# Patient Record
Sex: Female | Born: 1950 | Race: Black or African American | Hispanic: No | Smoking: Former smoker
Health system: Southern US, Community
[De-identification: ages and names within clinical notes are randomized; demographics above are authoritative.]

## PROBLEM LIST (undated history)

## (undated) DIAGNOSIS — T380X5A Adverse effect of glucocorticoids and synthetic analogues, initial encounter: Secondary | ICD-10-CM

## (undated) DIAGNOSIS — H548 Legal blindness, as defined in USA: Secondary | ICD-10-CM

## (undated) DIAGNOSIS — K219 Gastro-esophageal reflux disease without esophagitis: Secondary | ICD-10-CM

## (undated) DIAGNOSIS — E099 Drug or chemical induced diabetes mellitus without complications: Secondary | ICD-10-CM

## (undated) DIAGNOSIS — I776 Arteritis, unspecified: Secondary | ICD-10-CM

## (undated) HISTORY — PX: ABDOMINAL HYSTERECTOMY: SHX81

## (undated) HISTORY — DX: Legal blindness, as defined in USA: H54.8

## (undated) HISTORY — DX: Adverse effect of glucocorticoids and synthetic analogues, initial encounter: T38.0X5A

## (undated) HISTORY — DX: Drug or chemical induced diabetes mellitus without complications: E09.9

## (undated) HISTORY — DX: Arteritis, unspecified: I77.6

## (undated) HISTORY — DX: Gastro-esophageal reflux disease without esophagitis: K21.9

---

## 2000-02-28 ENCOUNTER — Emergency Department (HOSPITAL_COMMUNITY): Admission: EM | Admit: 2000-02-28 | Discharge: 2000-02-28 | Payer: Self-pay | Admitting: Emergency Medicine

## 2000-03-13 ENCOUNTER — Other Ambulatory Visit: Admission: RE | Admit: 2000-03-13 | Discharge: 2000-03-13 | Payer: Self-pay

## 2001-02-17 ENCOUNTER — Other Ambulatory Visit: Admission: RE | Admit: 2001-02-17 | Discharge: 2001-02-17 | Payer: Self-pay | Admitting: Family Medicine

## 2001-09-22 ENCOUNTER — Encounter: Payer: Self-pay | Admitting: Family Medicine

## 2001-09-22 ENCOUNTER — Encounter: Admission: RE | Admit: 2001-09-22 | Discharge: 2001-09-22 | Payer: Self-pay | Admitting: Family Medicine

## 2001-09-24 ENCOUNTER — Encounter: Admission: RE | Admit: 2001-09-24 | Discharge: 2001-09-24 | Payer: Self-pay | Admitting: Family Medicine

## 2001-09-24 ENCOUNTER — Encounter: Payer: Self-pay | Admitting: Family Medicine

## 2001-09-24 ENCOUNTER — Encounter (INDEPENDENT_AMBULATORY_CARE_PROVIDER_SITE_OTHER): Payer: Self-pay | Admitting: *Deleted

## 2002-03-08 ENCOUNTER — Emergency Department (HOSPITAL_COMMUNITY): Admission: EM | Admit: 2002-03-08 | Discharge: 2002-03-08 | Payer: Self-pay | Admitting: Emergency Medicine

## 2002-03-09 ENCOUNTER — Emergency Department (HOSPITAL_COMMUNITY): Admission: EM | Admit: 2002-03-09 | Discharge: 2002-03-09 | Payer: Self-pay | Admitting: Emergency Medicine

## 2002-11-02 ENCOUNTER — Other Ambulatory Visit: Admission: RE | Admit: 2002-11-02 | Discharge: 2002-11-02 | Payer: Self-pay | Admitting: Family Medicine

## 2003-12-18 ENCOUNTER — Emergency Department (HOSPITAL_COMMUNITY): Admission: AD | Admit: 2003-12-18 | Discharge: 2003-12-18 | Payer: Self-pay | Admitting: Family Medicine

## 2004-04-21 ENCOUNTER — Emergency Department (HOSPITAL_COMMUNITY): Admission: EM | Admit: 2004-04-21 | Discharge: 2004-04-22 | Payer: Self-pay | Admitting: Emergency Medicine

## 2006-03-08 ENCOUNTER — Other Ambulatory Visit: Admission: RE | Admit: 2006-03-08 | Discharge: 2006-03-08 | Payer: Self-pay | Admitting: Family Medicine

## 2006-12-11 ENCOUNTER — Encounter: Admission: RE | Admit: 2006-12-11 | Discharge: 2007-01-23 | Payer: Self-pay | Admitting: Family Medicine

## 2007-01-24 ENCOUNTER — Encounter: Admission: RE | Admit: 2007-01-24 | Discharge: 2007-02-05 | Payer: Self-pay | Admitting: Family Medicine

## 2007-04-23 ENCOUNTER — Encounter: Admission: RE | Admit: 2007-04-23 | Discharge: 2007-04-23 | Payer: Self-pay | Admitting: Family Medicine

## 2007-07-18 ENCOUNTER — Encounter: Admission: RE | Admit: 2007-07-18 | Discharge: 2007-07-18 | Payer: Self-pay | Admitting: Family Medicine

## 2009-10-05 ENCOUNTER — Encounter: Admission: RE | Admit: 2009-10-05 | Discharge: 2009-10-05 | Payer: Self-pay | Admitting: Family Medicine

## 2010-06-05 ENCOUNTER — Encounter: Admission: RE | Admit: 2010-06-05 | Discharge: 2010-06-05 | Payer: Self-pay | Admitting: Family Medicine

## 2012-12-31 ENCOUNTER — Other Ambulatory Visit: Payer: Self-pay

## 2013-03-11 ENCOUNTER — Ambulatory Visit
Admission: RE | Admit: 2013-03-11 | Discharge: 2013-03-11 | Disposition: A | Discharge status: Home / Self Care | Payer: Medicare PPO | Source: Ambulatory Visit

## 2013-03-11 DIAGNOSIS — Z1231 Encounter for screening mammogram for malignant neoplasm of breast: Secondary | ICD-10-CM

## 2014-09-18 ENCOUNTER — Encounter: Payer: Self-pay | Admitting: *Deleted

## 2015-01-03 DIAGNOSIS — Z970 Presence of artificial eye: Secondary | ICD-10-CM | POA: Diagnosis not present

## 2015-01-03 DIAGNOSIS — H548 Legal blindness, as defined in USA: Secondary | ICD-10-CM | POA: Diagnosis not present

## 2015-01-03 DIAGNOSIS — H4011X3 Primary open-angle glaucoma, severe stage: Secondary | ICD-10-CM | POA: Diagnosis not present

## 2015-07-12 DIAGNOSIS — E119 Type 2 diabetes mellitus without complications: Secondary | ICD-10-CM | POA: Diagnosis not present

## 2015-07-12 DIAGNOSIS — Z23 Encounter for immunization: Secondary | ICD-10-CM | POA: Diagnosis not present

## 2015-07-12 DIAGNOSIS — L109 Pemphigus, unspecified: Secondary | ICD-10-CM | POA: Diagnosis not present

## 2015-07-12 DIAGNOSIS — E780 Pure hypercholesterolemia: Secondary | ICD-10-CM | POA: Diagnosis not present

## 2015-07-12 DIAGNOSIS — R002 Palpitations: Secondary | ICD-10-CM | POA: Diagnosis not present

## 2015-07-22 DIAGNOSIS — L309 Dermatitis, unspecified: Secondary | ICD-10-CM | POA: Diagnosis not present

## 2016-02-21 ENCOUNTER — Other Ambulatory Visit: Payer: Self-pay

## 2016-02-21 DIAGNOSIS — Z1231 Encounter for screening mammogram for malignant neoplasm of breast: Secondary | ICD-10-CM

## 2016-05-11 ENCOUNTER — Ambulatory Visit
Admission: RE | Admit: 2016-05-11 | Discharge: 2016-05-11 | Disposition: A | Discharge status: Home / Self Care | Payer: Medicare Other | Source: Ambulatory Visit

## 2016-05-11 DIAGNOSIS — Z1231 Encounter for screening mammogram for malignant neoplasm of breast: Secondary | ICD-10-CM

## 2018-02-06 ENCOUNTER — Other Ambulatory Visit (HOSPITAL_COMMUNITY): Payer: Self-pay | Admitting: Physician Assistant

## 2018-02-06 DIAGNOSIS — M79605 Pain in left leg: Secondary | ICD-10-CM

## 2018-02-06 DIAGNOSIS — M7989 Other specified soft tissue disorders: Principal | ICD-10-CM

## 2018-02-07 ENCOUNTER — Ambulatory Visit (HOSPITAL_COMMUNITY)
Admission: RE | Admit: 2018-02-07 | Discharge: 2018-02-07 | Disposition: A | Discharge status: Home / Self Care | Payer: Medicare Other | Source: Ambulatory Visit | Attending: Physician Assistant | Admitting: Physician Assistant

## 2018-02-07 DIAGNOSIS — M79605 Pain in left leg: Secondary | ICD-10-CM | POA: Insufficient documentation

## 2018-02-07 DIAGNOSIS — M7989 Other specified soft tissue disorders: Secondary | ICD-10-CM | POA: Insufficient documentation

## 2018-02-07 NOTE — Progress Notes (Signed)
LLE venous duplex prelim: negative for DVT. Farrel DemarkJill Eunice, RDMS, RVT  Attempted to call report to Azucena FallenMichelle Young with number given, as well as main line to urgent care. Cannot reach anyone for report at this time.

## 2019-05-25 ENCOUNTER — Other Ambulatory Visit: Payer: Self-pay | Admitting: Family Medicine

## 2019-05-25 DIAGNOSIS — M8580 Other specified disorders of bone density and structure, unspecified site: Secondary | ICD-10-CM

## 2019-06-01 ENCOUNTER — Other Ambulatory Visit: Payer: Self-pay | Admitting: Family Medicine

## 2019-06-01 DIAGNOSIS — Z1231 Encounter for screening mammogram for malignant neoplasm of breast: Secondary | ICD-10-CM

## 2019-08-14 ENCOUNTER — Ambulatory Visit
Admission: RE | Admit: 2019-08-14 | Discharge: 2019-08-14 | Disposition: A | Discharge status: Home / Self Care | Payer: Medicare Other | Source: Ambulatory Visit | Attending: Family Medicine | Admitting: Family Medicine

## 2019-08-14 ENCOUNTER — Other Ambulatory Visit: Payer: Self-pay

## 2019-08-14 DIAGNOSIS — Z1231 Encounter for screening mammogram for malignant neoplasm of breast: Secondary | ICD-10-CM

## 2019-08-14 DIAGNOSIS — M8580 Other specified disorders of bone density and structure, unspecified site: Secondary | ICD-10-CM

## 2019-11-26 ENCOUNTER — Ambulatory Visit: Payer: Self-pay | Attending: Internal Medicine

## 2019-11-26 DIAGNOSIS — Z23 Encounter for immunization: Secondary | ICD-10-CM | POA: Insufficient documentation

## 2019-11-26 NOTE — Progress Notes (Signed)
   Covid-19 Vaccination Clinic  Name:  Alyss Granato    MRN: [id number] DOB: 1951/03/13  11/26/2019  Ms. Suleiman was observed post Covid-19 immunization for 15 minutes without incidence. She was provided with Vaccine Information Sheet and instruction to access the V-Safe system.   Ms. Aristizabal was instructed to call 911 with any severe reactions post vaccine: Marland Kitchen Difficulty breathing  . Swelling of your face and throat  . A fast heartbeat  . A bad rash all over your body  . Dizziness and weakness    Immunizations Administered    Name Date Dose VIS Date Route   Pfizer COVID-19 Vaccine 11/26/2019  2:28 PM 0.3 mL 10/16/2019 Intramuscular   Manufacturer: [employer], [employer]   Lot: [id number]   NDC: [address]-2

## 2019-12-17 ENCOUNTER — Ambulatory Visit: Payer: Self-pay | Attending: Internal Medicine

## 2019-12-17 DIAGNOSIS — Z23 Encounter for immunization: Secondary | ICD-10-CM | POA: Insufficient documentation

## 2019-12-17 NOTE — Progress Notes (Signed)
   Covid-19 Vaccination Clinic  Name:  Suha Schoenbeck    MRN: [id number] DOB: 1951-04-10  12/17/2019  Ms. Towell was observed post Covid-19 immunization for 15 minutes without incidence. She was provided with Vaccine Information Sheet and instruction to access the V-Safe system.   Ms. Huckaba was instructed to call 911 with any severe reactions post vaccine: Marland Kitchen Difficulty breathing  . Swelling of your face and throat  . A fast heartbeat  . A bad rash all over your body  . Dizziness and weakness    Immunizations Administered    Name Date Dose VIS Date Route   Pfizer COVID-19 Vaccine 12/17/2019  2:22 PM 0.3 mL 10/16/2019 Intramuscular   Manufacturer: [employer], [employer]   Lot: [id number]   NDC: [address]-3

## 2020-03-29 DIAGNOSIS — E669 Obesity, unspecified: Secondary | ICD-10-CM | POA: Diagnosis not present

## 2020-03-29 DIAGNOSIS — H548 Legal blindness, as defined in USA: Secondary | ICD-10-CM | POA: Diagnosis not present

## 2020-03-29 DIAGNOSIS — M2550 Pain in unspecified joint: Secondary | ICD-10-CM | POA: Diagnosis not present

## 2020-03-29 DIAGNOSIS — M318 Other specified necrotizing vasculopathies: Secondary | ICD-10-CM | POA: Diagnosis not present

## 2020-03-29 DIAGNOSIS — R768 Other specified abnormal immunological findings in serum: Secondary | ICD-10-CM | POA: Diagnosis not present

## 2020-03-29 DIAGNOSIS — R5382 Chronic fatigue, unspecified: Secondary | ICD-10-CM | POA: Diagnosis not present

## 2020-03-29 DIAGNOSIS — Z6836 Body mass index (BMI) 36.0-36.9, adult: Secondary | ICD-10-CM | POA: Diagnosis not present

## 2020-03-29 DIAGNOSIS — Z79899 Other long term (current) drug therapy: Secondary | ICD-10-CM | POA: Diagnosis not present

## 2020-03-29 DIAGNOSIS — M0579 Rheumatoid arthritis with rheumatoid factor of multiple sites without organ or systems involvement: Secondary | ICD-10-CM | POA: Diagnosis not present

## 2020-04-20 DIAGNOSIS — H401113 Primary open-angle glaucoma, right eye, severe stage: Secondary | ICD-10-CM | POA: Diagnosis not present

## 2020-04-20 DIAGNOSIS — Z961 Presence of intraocular lens: Secondary | ICD-10-CM | POA: Diagnosis not present

## 2020-04-20 DIAGNOSIS — H26491 Other secondary cataract, right eye: Secondary | ICD-10-CM | POA: Diagnosis not present

## 2020-05-31 DIAGNOSIS — J45909 Unspecified asthma, uncomplicated: Secondary | ICD-10-CM | POA: Diagnosis not present

## 2020-05-31 DIAGNOSIS — I10 Essential (primary) hypertension: Secondary | ICD-10-CM | POA: Diagnosis not present

## 2020-05-31 DIAGNOSIS — Z0000 Encounter for general adult medical examination without abnormal findings: Secondary | ICD-10-CM | POA: Diagnosis not present

## 2020-05-31 DIAGNOSIS — E1169 Type 2 diabetes mellitus with other specified complication: Secondary | ICD-10-CM | POA: Diagnosis not present

## 2020-05-31 DIAGNOSIS — E7800 Pure hypercholesterolemia, unspecified: Secondary | ICD-10-CM | POA: Diagnosis not present

## 2020-05-31 DIAGNOSIS — K219 Gastro-esophageal reflux disease without esophagitis: Secondary | ICD-10-CM | POA: Diagnosis not present

## 2020-05-31 DIAGNOSIS — M859 Disorder of bone density and structure, unspecified: Secondary | ICD-10-CM | POA: Diagnosis not present

## 2020-05-31 DIAGNOSIS — Z1389 Encounter for screening for other disorder: Secondary | ICD-10-CM | POA: Diagnosis not present

## 2020-05-31 DIAGNOSIS — M1990 Unspecified osteoarthritis, unspecified site: Secondary | ICD-10-CM | POA: Diagnosis not present

## 2020-06-29 DIAGNOSIS — Z6836 Body mass index (BMI) 36.0-36.9, adult: Secondary | ICD-10-CM | POA: Diagnosis not present

## 2020-06-29 DIAGNOSIS — R5382 Chronic fatigue, unspecified: Secondary | ICD-10-CM | POA: Diagnosis not present

## 2020-06-29 DIAGNOSIS — H548 Legal blindness, as defined in USA: Secondary | ICD-10-CM | POA: Diagnosis not present

## 2020-06-29 DIAGNOSIS — M318 Other specified necrotizing vasculopathies: Secondary | ICD-10-CM | POA: Diagnosis not present

## 2020-06-29 DIAGNOSIS — Z79899 Other long term (current) drug therapy: Secondary | ICD-10-CM | POA: Diagnosis not present

## 2020-06-29 DIAGNOSIS — E669 Obesity, unspecified: Secondary | ICD-10-CM | POA: Diagnosis not present

## 2020-06-29 DIAGNOSIS — M2550 Pain in unspecified joint: Secondary | ICD-10-CM | POA: Diagnosis not present

## 2020-06-29 DIAGNOSIS — M0579 Rheumatoid arthritis with rheumatoid factor of multiple sites without organ or systems involvement: Secondary | ICD-10-CM | POA: Diagnosis not present

## 2020-10-10 DIAGNOSIS — H548 Legal blindness, as defined in USA: Secondary | ICD-10-CM | POA: Diagnosis not present

## 2020-10-10 DIAGNOSIS — E669 Obesity, unspecified: Secondary | ICD-10-CM | POA: Diagnosis not present

## 2020-10-10 DIAGNOSIS — R5382 Chronic fatigue, unspecified: Secondary | ICD-10-CM | POA: Diagnosis not present

## 2020-10-10 DIAGNOSIS — Z6837 Body mass index (BMI) 37.0-37.9, adult: Secondary | ICD-10-CM | POA: Diagnosis not present

## 2020-10-10 DIAGNOSIS — M2550 Pain in unspecified joint: Secondary | ICD-10-CM | POA: Diagnosis not present

## 2020-10-10 DIAGNOSIS — Z79899 Other long term (current) drug therapy: Secondary | ICD-10-CM | POA: Diagnosis not present

## 2020-10-10 DIAGNOSIS — M0579 Rheumatoid arthritis with rheumatoid factor of multiple sites without organ or systems involvement: Secondary | ICD-10-CM | POA: Diagnosis not present

## 2020-10-10 DIAGNOSIS — M318 Other specified necrotizing vasculopathies: Secondary | ICD-10-CM | POA: Diagnosis not present

## 2020-11-16 DIAGNOSIS — M069 Rheumatoid arthritis, unspecified: Secondary | ICD-10-CM | POA: Diagnosis not present

## 2020-11-16 DIAGNOSIS — H401113 Primary open-angle glaucoma, right eye, severe stage: Secondary | ICD-10-CM | POA: Diagnosis not present

## 2020-11-16 DIAGNOSIS — H26491 Other secondary cataract, right eye: Secondary | ICD-10-CM | POA: Diagnosis not present

## 2020-11-16 DIAGNOSIS — Z961 Presence of intraocular lens: Secondary | ICD-10-CM | POA: Diagnosis not present

## 2020-12-01 DIAGNOSIS — I10 Essential (primary) hypertension: Secondary | ICD-10-CM | POA: Diagnosis not present

## 2020-12-01 DIAGNOSIS — L509 Urticaria, unspecified: Secondary | ICD-10-CM | POA: Diagnosis not present

## 2020-12-01 DIAGNOSIS — E1169 Type 2 diabetes mellitus with other specified complication: Secondary | ICD-10-CM | POA: Diagnosis not present

## 2020-12-01 DIAGNOSIS — H401113 Primary open-angle glaucoma, right eye, severe stage: Secondary | ICD-10-CM | POA: Diagnosis not present

## 2020-12-01 DIAGNOSIS — E7800 Pure hypercholesterolemia, unspecified: Secondary | ICD-10-CM | POA: Diagnosis not present

## 2020-12-01 DIAGNOSIS — H540X33 Blindness right eye category 3, blindness left eye category 3: Secondary | ICD-10-CM | POA: Diagnosis not present

## 2020-12-01 DIAGNOSIS — M069 Rheumatoid arthritis, unspecified: Secondary | ICD-10-CM | POA: Diagnosis not present

## 2020-12-01 DIAGNOSIS — H541131 Blindness right eye category 3, low vision left eye category 1: Secondary | ICD-10-CM | POA: Diagnosis not present

## 2021-01-09 DIAGNOSIS — M0579 Rheumatoid arthritis with rheumatoid factor of multiple sites without organ or systems involvement: Secondary | ICD-10-CM | POA: Diagnosis not present

## 2021-04-18 DIAGNOSIS — M318 Other specified necrotizing vasculopathies: Secondary | ICD-10-CM | POA: Diagnosis not present

## 2021-04-18 DIAGNOSIS — M0579 Rheumatoid arthritis with rheumatoid factor of multiple sites without organ or systems involvement: Secondary | ICD-10-CM | POA: Diagnosis not present

## 2021-04-18 DIAGNOSIS — H548 Legal blindness, as defined in USA: Secondary | ICD-10-CM | POA: Diagnosis not present

## 2021-04-18 DIAGNOSIS — R5382 Chronic fatigue, unspecified: Secondary | ICD-10-CM | POA: Diagnosis not present

## 2021-04-18 DIAGNOSIS — Z79899 Other long term (current) drug therapy: Secondary | ICD-10-CM | POA: Diagnosis not present

## 2021-04-18 DIAGNOSIS — Z6834 Body mass index (BMI) 34.0-34.9, adult: Secondary | ICD-10-CM | POA: Diagnosis not present

## 2021-04-18 DIAGNOSIS — E669 Obesity, unspecified: Secondary | ICD-10-CM | POA: Diagnosis not present

## 2021-04-18 DIAGNOSIS — M2550 Pain in unspecified joint: Secondary | ICD-10-CM | POA: Diagnosis not present

## 2021-05-15 DIAGNOSIS — H401113 Primary open-angle glaucoma, right eye, severe stage: Secondary | ICD-10-CM | POA: Diagnosis not present

## 2021-05-15 DIAGNOSIS — H26491 Other secondary cataract, right eye: Secondary | ICD-10-CM | POA: Diagnosis not present

## 2021-05-15 DIAGNOSIS — Z961 Presence of intraocular lens: Secondary | ICD-10-CM | POA: Diagnosis not present

## 2021-05-15 DIAGNOSIS — M069 Rheumatoid arthritis, unspecified: Secondary | ICD-10-CM | POA: Diagnosis not present

## 2021-06-02 DIAGNOSIS — I10 Essential (primary) hypertension: Secondary | ICD-10-CM | POA: Diagnosis not present

## 2021-06-02 DIAGNOSIS — E1169 Type 2 diabetes mellitus with other specified complication: Secondary | ICD-10-CM | POA: Diagnosis not present

## 2021-06-02 DIAGNOSIS — G4733 Obstructive sleep apnea (adult) (pediatric): Secondary | ICD-10-CM | POA: Diagnosis not present

## 2021-06-02 DIAGNOSIS — H543 Unqualified visual loss, both eyes: Secondary | ICD-10-CM | POA: Diagnosis not present

## 2021-06-02 DIAGNOSIS — K219 Gastro-esophageal reflux disease without esophagitis: Secondary | ICD-10-CM | POA: Diagnosis not present

## 2021-06-02 DIAGNOSIS — M1990 Unspecified osteoarthritis, unspecified site: Secondary | ICD-10-CM | POA: Diagnosis not present

## 2021-06-02 DIAGNOSIS — E7800 Pure hypercholesterolemia, unspecified: Secondary | ICD-10-CM | POA: Diagnosis not present

## 2021-06-02 DIAGNOSIS — Z1159 Encounter for screening for other viral diseases: Secondary | ICD-10-CM | POA: Diagnosis not present

## 2021-06-02 DIAGNOSIS — Z0000 Encounter for general adult medical examination without abnormal findings: Secondary | ICD-10-CM | POA: Diagnosis not present

## 2021-06-02 DIAGNOSIS — Z1389 Encounter for screening for other disorder: Secondary | ICD-10-CM | POA: Diagnosis not present

## 2021-06-06 ENCOUNTER — Other Ambulatory Visit: Payer: Self-pay | Admitting: Family Medicine

## 2021-06-06 DIAGNOSIS — Z1231 Encounter for screening mammogram for malignant neoplasm of breast: Secondary | ICD-10-CM

## 2021-06-16 DIAGNOSIS — G4733 Obstructive sleep apnea (adult) (pediatric): Secondary | ICD-10-CM | POA: Diagnosis not present

## 2021-06-16 DIAGNOSIS — E669 Obesity, unspecified: Secondary | ICD-10-CM | POA: Diagnosis not present

## 2021-06-16 DIAGNOSIS — M069 Rheumatoid arthritis, unspecified: Secondary | ICD-10-CM | POA: Diagnosis not present

## 2021-06-16 DIAGNOSIS — I10 Essential (primary) hypertension: Secondary | ICD-10-CM | POA: Diagnosis not present

## 2021-06-23 DIAGNOSIS — N3941 Urge incontinence: Secondary | ICD-10-CM | POA: Diagnosis not present

## 2021-06-23 DIAGNOSIS — Z811 Family history of alcohol abuse and dependence: Secondary | ICD-10-CM | POA: Diagnosis not present

## 2021-06-23 DIAGNOSIS — H548 Legal blindness, as defined in USA: Secondary | ICD-10-CM | POA: Diagnosis not present

## 2021-06-23 DIAGNOSIS — M069 Rheumatoid arthritis, unspecified: Secondary | ICD-10-CM | POA: Diagnosis not present

## 2021-06-23 DIAGNOSIS — H409 Unspecified glaucoma: Secondary | ICD-10-CM | POA: Diagnosis not present

## 2021-06-23 DIAGNOSIS — G4733 Obstructive sleep apnea (adult) (pediatric): Secondary | ICD-10-CM | POA: Diagnosis not present

## 2021-06-23 DIAGNOSIS — Z6836 Body mass index (BMI) 36.0-36.9, adult: Secondary | ICD-10-CM | POA: Diagnosis not present

## 2021-06-23 DIAGNOSIS — R609 Edema, unspecified: Secondary | ICD-10-CM | POA: Diagnosis not present

## 2021-06-23 DIAGNOSIS — M8580 Other specified disorders of bone density and structure, unspecified site: Secondary | ICD-10-CM | POA: Diagnosis not present

## 2021-06-23 DIAGNOSIS — D84821 Immunodeficiency due to drugs: Secondary | ICD-10-CM | POA: Diagnosis not present

## 2021-06-23 DIAGNOSIS — G8929 Other chronic pain: Secondary | ICD-10-CM | POA: Diagnosis not present

## 2021-06-23 DIAGNOSIS — Z79899 Other long term (current) drug therapy: Secondary | ICD-10-CM | POA: Diagnosis not present

## 2021-06-23 DIAGNOSIS — E785 Hyperlipidemia, unspecified: Secondary | ICD-10-CM | POA: Diagnosis not present

## 2021-06-23 DIAGNOSIS — E119 Type 2 diabetes mellitus without complications: Secondary | ICD-10-CM | POA: Diagnosis not present

## 2021-06-23 DIAGNOSIS — Z7982 Long term (current) use of aspirin: Secondary | ICD-10-CM | POA: Diagnosis not present

## 2021-06-23 DIAGNOSIS — Z7409 Other reduced mobility: Secondary | ICD-10-CM | POA: Diagnosis not present

## 2021-06-23 DIAGNOSIS — Z818 Family history of other mental and behavioral disorders: Secondary | ICD-10-CM | POA: Diagnosis not present

## 2021-07-18 DIAGNOSIS — I10 Essential (primary) hypertension: Secondary | ICD-10-CM | POA: Diagnosis not present

## 2021-07-18 DIAGNOSIS — R001 Bradycardia, unspecified: Secondary | ICD-10-CM | POA: Diagnosis not present

## 2021-07-24 DIAGNOSIS — G4733 Obstructive sleep apnea (adult) (pediatric): Secondary | ICD-10-CM | POA: Diagnosis not present

## 2021-07-24 DIAGNOSIS — I10 Essential (primary) hypertension: Secondary | ICD-10-CM | POA: Diagnosis not present

## 2021-07-24 DIAGNOSIS — M069 Rheumatoid arthritis, unspecified: Secondary | ICD-10-CM | POA: Diagnosis not present

## 2021-07-24 DIAGNOSIS — E669 Obesity, unspecified: Secondary | ICD-10-CM | POA: Diagnosis not present

## 2021-07-27 ENCOUNTER — Ambulatory Visit: Payer: Self-pay

## 2021-07-28 ENCOUNTER — Ambulatory Visit: Payer: Self-pay | Admitting: Cardiovascular Disease

## 2021-08-14 DIAGNOSIS — Z79899 Other long term (current) drug therapy: Secondary | ICD-10-CM | POA: Diagnosis not present

## 2021-08-14 DIAGNOSIS — M0579 Rheumatoid arthritis with rheumatoid factor of multiple sites without organ or systems involvement: Secondary | ICD-10-CM | POA: Diagnosis not present

## 2021-09-08 ENCOUNTER — Other Ambulatory Visit (HOSPITAL_BASED_OUTPATIENT_CLINIC_OR_DEPARTMENT_OTHER): Payer: Self-pay

## 2021-09-08 ENCOUNTER — Other Ambulatory Visit: Payer: Self-pay

## 2021-09-08 ENCOUNTER — Ambulatory Visit
Admission: RE | Admit: 2021-09-08 | Discharge: 2021-09-08 | Disposition: A | Discharge status: Home / Self Care | Payer: Medicare PPO | Source: Ambulatory Visit | Attending: Family Medicine | Admitting: Family Medicine

## 2021-09-08 DIAGNOSIS — R0683 Snoring: Secondary | ICD-10-CM

## 2021-09-08 DIAGNOSIS — Z1231 Encounter for screening mammogram for malignant neoplasm of breast: Secondary | ICD-10-CM

## 2021-09-08 DIAGNOSIS — R0681 Apnea, not elsewhere classified: Secondary | ICD-10-CM

## 2021-09-08 DIAGNOSIS — G4710 Hypersomnia, unspecified: Secondary | ICD-10-CM

## 2021-09-09 DIAGNOSIS — Z23 Encounter for immunization: Secondary | ICD-10-CM | POA: Diagnosis not present

## 2021-09-13 ENCOUNTER — Other Ambulatory Visit: Payer: Self-pay | Admitting: Family Medicine

## 2021-09-13 DIAGNOSIS — R928 Other abnormal and inconclusive findings on diagnostic imaging of breast: Secondary | ICD-10-CM

## 2021-10-05 ENCOUNTER — Encounter: Payer: Self-pay | Admitting: Cardiology

## 2021-10-05 ENCOUNTER — Inpatient Hospital Stay: Payer: Medicare PPO

## 2021-10-05 ENCOUNTER — Other Ambulatory Visit: Payer: Self-pay

## 2021-10-05 ENCOUNTER — Ambulatory Visit: Payer: Medicare PPO | Admitting: Cardiology

## 2021-10-05 VITALS — BP 135/74 | HR 56 | Temp 98.1°F | Resp 16 | Ht 64.0 in | Wt 195.8 lb

## 2021-10-05 DIAGNOSIS — R001 Bradycardia, unspecified: Secondary | ICD-10-CM

## 2021-10-05 NOTE — Progress Notes (Signed)
Patient referred by Maurice Small, MD for bradycardia  Subjective:   Kim Henry, female    DOB: 05-13-1951, 70 y.o.   MRN: [id number]   Chief Complaint  Patient presents with   Bradycardia   New Patient (Initial Visit)    Referred by Maurice Small, MD     HPI  70 y.o. African-American female with h/o vasculitis, steroid-induced diabetes, legally blind, referred for bradycardia.  Patient is retired, used to work at [employer].  She was recently noticed to have heart rate in 40s and 50s.  She denies any symptoms of chest pain, shortness of breath, lightheadedness, presyncope or syncope.  She has a history of vasculitis with pain and swelling in left leg in the past.  She has been on medications for the same, including prednisone, methotrexate.  She is also on aspirin, but states that this was not started for her vasculitis but rather due to family history of heart disease.  Patient does not have known diagnosis of ASCVD.   Past Medical History:  Diagnosis Date   GERD (gastroesophageal reflux disease)    Legally blind    Steroid-induced diabetes (HCC)    Vasculitis (HCC)      Past Surgical History:  Procedure Laterality Date   ABDOMINAL HYSTERECTOMY       Social History   Tobacco Use  Smoking Status Former  Smokeless Tobacco Not on file    Social History   Substance and Sexual Activity  Alcohol Use No     Family History  Family history unknown: Yes     Current Outpatient Medications on File Prior to Visit  Medication Sig Dispense Refill   aspirin 81 MG chewable tablet Chew by mouth daily.     calcium carbonate 200 MG capsule Take 250 mg by mouth 2 (two) times daily with a meal.     CALCIUM PO Take by mouth.     Multiple Vitamins-Minerals (MULTIVITAL PO) Take by mouth.     omeprazole (PRILOSEC) 40 MG capsule Take 40 mg by mouth daily.     VITAMIN D, CHOLECALCIFEROL, PO Take by mouth.     No current facility-administered  medications on file prior to visit.    Cardiovascular and other pertinent studies:  EKG 10/05/2021: Sinus rhythm 53 bpm LVH  Recent labs: 06/02/2021: Glucose 103, BUN/Cr 16/0.79. EGFR 73. Na/K 141/4.6. Rest of the CMP normal CBC NA HbA1C 6.5% Chol 135, TG 53, HDL 49, LDL 74 TSH 1.5 normal  Review of Systems  Cardiovascular:  Negative for chest pain, dyspnea on exertion, leg swelling, palpitations and syncope.       Vitals:   10/05/21 0941  BP: 135/74  Pulse: (!) 56  Resp: 16  Temp: 98.1 F (36.7 C)  SpO2: 96%     Body mass index is 33.61 kg/m. Filed Weights   10/05/21 0941  Weight: 195 lb 12.8 oz (88.8 kg)    Objective:   Physical Exam Vitals and nursing note reviewed.  Constitutional:      General: She is not in acute distress. Neck:     Vascular: No JVD.  Cardiovascular:     Rate and Rhythm: Regular rhythm. Bradycardia present.     Heart sounds: Normal heart sounds. No murmur heard. Pulmonary:     Effort: Pulmonary effort is normal.     Breath sounds: Normal breath sounds. No wheezing or rales.        Assessment & Recommendations:   70 y.o. African-American female  with h/o vasculitis, steroid-induced diabetes, legally blind, referred for bradycardia.  Bradycardia: Incidental finding of sinus bradycardia.  Normal TSH.  Patient is not athletic to explain the bradycardia.  Etiology remains unclear.  Fortunately, she does not have any symptoms related to this.  Recommend 2-week monitor, echocardiogram, and an exercise treadmill stress test to assess chronotropic competence.  On a separate note, I do think she needs aspirin for primary prevention.  If aspirin was started for vasculitis, I defer this to patient's PCP.  Further recommendations after above testing.   Thank you for referring the patient to [place]. Please feel free to contact with any questions.   Nigel Mormon, MD Pager: [phone] Office: [phone]

## 2021-10-18 DIAGNOSIS — Z6834 Body mass index (BMI) 34.0-34.9, adult: Secondary | ICD-10-CM | POA: Diagnosis not present

## 2021-10-18 DIAGNOSIS — E669 Obesity, unspecified: Secondary | ICD-10-CM | POA: Diagnosis not present

## 2021-10-18 DIAGNOSIS — M0579 Rheumatoid arthritis with rheumatoid factor of multiple sites without organ or systems involvement: Secondary | ICD-10-CM | POA: Diagnosis not present

## 2021-10-18 DIAGNOSIS — M318 Other specified necrotizing vasculopathies: Secondary | ICD-10-CM | POA: Diagnosis not present

## 2021-10-18 DIAGNOSIS — M2550 Pain in unspecified joint: Secondary | ICD-10-CM | POA: Diagnosis not present

## 2021-10-18 DIAGNOSIS — R5382 Chronic fatigue, unspecified: Secondary | ICD-10-CM | POA: Diagnosis not present

## 2021-10-18 DIAGNOSIS — Z79899 Other long term (current) drug therapy: Secondary | ICD-10-CM | POA: Diagnosis not present

## 2021-10-18 DIAGNOSIS — H548 Legal blindness, as defined in USA: Secondary | ICD-10-CM | POA: Diagnosis not present

## 2021-10-19 ENCOUNTER — Other Ambulatory Visit: Payer: Self-pay | Admitting: Family Medicine

## 2021-10-19 ENCOUNTER — Ambulatory Visit: Admission: RE | Admit: 2021-10-19 | Payer: Medicare PPO | Source: Ambulatory Visit

## 2021-10-19 ENCOUNTER — Ambulatory Visit
Admission: RE | Admit: 2021-10-19 | Discharge: 2021-10-19 | Disposition: A | Discharge status: Home / Self Care | Payer: Medicare PPO | Source: Ambulatory Visit | Attending: Family Medicine | Admitting: Family Medicine

## 2021-10-19 DIAGNOSIS — R922 Inconclusive mammogram: Secondary | ICD-10-CM | POA: Diagnosis not present

## 2021-10-19 DIAGNOSIS — R928 Other abnormal and inconclusive findings on diagnostic imaging of breast: Secondary | ICD-10-CM | POA: Diagnosis not present

## 2021-10-19 DIAGNOSIS — R921 Mammographic calcification found on diagnostic imaging of breast: Secondary | ICD-10-CM | POA: Diagnosis not present

## 2021-10-25 ENCOUNTER — Other Ambulatory Visit: Payer: Self-pay | Admitting: Family Medicine

## 2021-10-25 ENCOUNTER — Ambulatory Visit
Admission: RE | Admit: 2021-10-25 | Discharge: 2021-10-25 | Disposition: A | Discharge status: Home / Self Care | Payer: Medicare PPO | Source: Ambulatory Visit | Attending: Family Medicine | Admitting: Family Medicine

## 2021-10-25 DIAGNOSIS — R921 Mammographic calcification found on diagnostic imaging of breast: Secondary | ICD-10-CM | POA: Diagnosis not present

## 2021-10-25 DIAGNOSIS — R928 Other abnormal and inconclusive findings on diagnostic imaging of breast: Secondary | ICD-10-CM

## 2021-10-25 DIAGNOSIS — N6489 Other specified disorders of breast: Secondary | ICD-10-CM | POA: Diagnosis not present

## 2021-10-25 HISTORY — PX: BREAST BIOPSY: SHX20

## 2021-11-10 ENCOUNTER — Ambulatory Visit: Payer: Medicare PPO

## 2021-11-10 ENCOUNTER — Other Ambulatory Visit: Payer: Self-pay

## 2021-11-10 DIAGNOSIS — R001 Bradycardia, unspecified: Secondary | ICD-10-CM | POA: Diagnosis not present

## 2021-11-24 NOTE — Progress Notes (Signed)
Called pt to inform her about her echo and stress test. Pt mention she would like to talk to her PCP before coming back to the office for a f/u appt with you

## 2021-11-24 NOTE — Progress Notes (Signed)
Heart function is normal on echocardiogram. Treadmill stress test at reduced exercise level showed moderate increase in heart rate. I do not think you need pacemaker, but recommend clinical follow up in 3 months.  Thanks MJP

## 2021-12-04 DIAGNOSIS — E7800 Pure hypercholesterolemia, unspecified: Secondary | ICD-10-CM | POA: Diagnosis not present

## 2021-12-04 DIAGNOSIS — Z23 Encounter for immunization: Secondary | ICD-10-CM | POA: Diagnosis not present

## 2021-12-04 DIAGNOSIS — I10 Essential (primary) hypertension: Secondary | ICD-10-CM | POA: Diagnosis not present

## 2021-12-04 DIAGNOSIS — E1165 Type 2 diabetes mellitus with hyperglycemia: Secondary | ICD-10-CM | POA: Diagnosis not present

## 2021-12-04 DIAGNOSIS — R001 Bradycardia, unspecified: Secondary | ICD-10-CM | POA: Diagnosis not present

## 2021-12-04 DIAGNOSIS — E1169 Type 2 diabetes mellitus with other specified complication: Secondary | ICD-10-CM | POA: Diagnosis not present

## 2021-12-20 DIAGNOSIS — H401113 Primary open-angle glaucoma, right eye, severe stage: Secondary | ICD-10-CM | POA: Diagnosis not present

## 2022-01-16 DIAGNOSIS — M0579 Rheumatoid arthritis with rheumatoid factor of multiple sites without organ or systems involvement: Secondary | ICD-10-CM | POA: Diagnosis not present

## 2022-04-24 DIAGNOSIS — E669 Obesity, unspecified: Secondary | ICD-10-CM | POA: Diagnosis not present

## 2022-04-24 DIAGNOSIS — H548 Legal blindness, as defined in USA: Secondary | ICD-10-CM | POA: Diagnosis not present

## 2022-04-24 DIAGNOSIS — M318 Other specified necrotizing vasculopathies: Secondary | ICD-10-CM | POA: Diagnosis not present

## 2022-04-24 DIAGNOSIS — Z79899 Other long term (current) drug therapy: Secondary | ICD-10-CM | POA: Diagnosis not present

## 2022-04-24 DIAGNOSIS — R5382 Chronic fatigue, unspecified: Secondary | ICD-10-CM | POA: Diagnosis not present

## 2022-04-24 DIAGNOSIS — Z6831 Body mass index (BMI) 31.0-31.9, adult: Secondary | ICD-10-CM | POA: Diagnosis not present

## 2022-04-24 DIAGNOSIS — M0579 Rheumatoid arthritis with rheumatoid factor of multiple sites without organ or systems involvement: Secondary | ICD-10-CM | POA: Diagnosis not present

## 2022-06-04 DIAGNOSIS — E1169 Type 2 diabetes mellitus with other specified complication: Secondary | ICD-10-CM | POA: Diagnosis not present

## 2022-06-04 DIAGNOSIS — K219 Gastro-esophageal reflux disease without esophagitis: Secondary | ICD-10-CM | POA: Diagnosis not present

## 2022-06-04 DIAGNOSIS — G4733 Obstructive sleep apnea (adult) (pediatric): Secondary | ICD-10-CM | POA: Diagnosis not present

## 2022-06-04 DIAGNOSIS — R001 Bradycardia, unspecified: Secondary | ICD-10-CM | POA: Diagnosis not present

## 2022-06-04 DIAGNOSIS — Z0000 Encounter for general adult medical examination without abnormal findings: Secondary | ICD-10-CM | POA: Diagnosis not present

## 2022-06-04 DIAGNOSIS — E7800 Pure hypercholesterolemia, unspecified: Secondary | ICD-10-CM | POA: Diagnosis not present

## 2022-06-04 DIAGNOSIS — H401113 Primary open-angle glaucoma, right eye, severe stage: Secondary | ICD-10-CM | POA: Diagnosis not present

## 2022-06-04 DIAGNOSIS — I10 Essential (primary) hypertension: Secondary | ICD-10-CM | POA: Diagnosis not present

## 2022-06-04 DIAGNOSIS — M069 Rheumatoid arthritis, unspecified: Secondary | ICD-10-CM | POA: Diagnosis not present

## 2022-06-07 ENCOUNTER — Other Ambulatory Visit: Payer: Self-pay | Admitting: Internal Medicine

## 2022-06-07 DIAGNOSIS — M810 Age-related osteoporosis without current pathological fracture: Secondary | ICD-10-CM

## 2022-07-15 DIAGNOSIS — R519 Headache, unspecified: Secondary | ICD-10-CM | POA: Diagnosis not present

## 2022-07-15 DIAGNOSIS — M791 Myalgia, unspecified site: Secondary | ICD-10-CM | POA: Diagnosis not present

## 2022-07-15 DIAGNOSIS — Z20822 Contact with and (suspected) exposure to covid-19: Secondary | ICD-10-CM | POA: Diagnosis not present

## 2022-07-15 DIAGNOSIS — J020 Streptococcal pharyngitis: Secondary | ICD-10-CM | POA: Diagnosis not present

## 2022-07-15 DIAGNOSIS — R509 Fever, unspecified: Secondary | ICD-10-CM | POA: Diagnosis not present

## 2022-07-15 DIAGNOSIS — R051 Acute cough: Secondary | ICD-10-CM | POA: Diagnosis not present

## 2022-07-25 DIAGNOSIS — Z79899 Other long term (current) drug therapy: Secondary | ICD-10-CM | POA: Diagnosis not present

## 2022-07-25 DIAGNOSIS — M0579 Rheumatoid arthritis with rheumatoid factor of multiple sites without organ or systems involvement: Secondary | ICD-10-CM | POA: Diagnosis not present

## 2022-08-16 DIAGNOSIS — H26491 Other secondary cataract, right eye: Secondary | ICD-10-CM | POA: Diagnosis not present

## 2022-08-16 DIAGNOSIS — Z961 Presence of intraocular lens: Secondary | ICD-10-CM | POA: Diagnosis not present

## 2022-08-16 DIAGNOSIS — M069 Rheumatoid arthritis, unspecified: Secondary | ICD-10-CM | POA: Diagnosis not present

## 2022-08-16 DIAGNOSIS — H401113 Primary open-angle glaucoma, right eye, severe stage: Secondary | ICD-10-CM | POA: Diagnosis not present

## 2022-09-12 DIAGNOSIS — J069 Acute upper respiratory infection, unspecified: Secondary | ICD-10-CM | POA: Diagnosis not present

## 2022-10-16 DIAGNOSIS — R5382 Chronic fatigue, unspecified: Secondary | ICD-10-CM | POA: Diagnosis not present

## 2022-10-16 DIAGNOSIS — E669 Obesity, unspecified: Secondary | ICD-10-CM | POA: Diagnosis not present

## 2022-10-16 DIAGNOSIS — M79642 Pain in left hand: Secondary | ICD-10-CM | POA: Diagnosis not present

## 2022-10-16 DIAGNOSIS — Z79899 Other long term (current) drug therapy: Secondary | ICD-10-CM | POA: Diagnosis not present

## 2022-10-16 DIAGNOSIS — M0579 Rheumatoid arthritis with rheumatoid factor of multiple sites without organ or systems involvement: Secondary | ICD-10-CM | POA: Diagnosis not present

## 2022-10-16 DIAGNOSIS — Z6831 Body mass index (BMI) 31.0-31.9, adult: Secondary | ICD-10-CM | POA: Diagnosis not present

## 2022-10-16 DIAGNOSIS — H548 Legal blindness, as defined in USA: Secondary | ICD-10-CM | POA: Diagnosis not present

## 2022-10-16 DIAGNOSIS — M318 Other specified necrotizing vasculopathies: Secondary | ICD-10-CM | POA: Diagnosis not present

## 2022-11-23 ENCOUNTER — Other Ambulatory Visit: Payer: Medicare PPO

## 2022-12-25 ENCOUNTER — Other Ambulatory Visit: Payer: Medicare PPO

## 2023-02-08 DIAGNOSIS — Z6831 Body mass index (BMI) 31.0-31.9, adult: Secondary | ICD-10-CM | POA: Diagnosis not present

## 2023-02-08 DIAGNOSIS — M0579 Rheumatoid arthritis with rheumatoid factor of multiple sites without organ or systems involvement: Secondary | ICD-10-CM | POA: Diagnosis not present

## 2023-02-08 DIAGNOSIS — R5382 Chronic fatigue, unspecified: Secondary | ICD-10-CM | POA: Diagnosis not present

## 2023-02-08 DIAGNOSIS — M318 Other specified necrotizing vasculopathies: Secondary | ICD-10-CM | POA: Diagnosis not present

## 2023-02-08 DIAGNOSIS — Z79899 Other long term (current) drug therapy: Secondary | ICD-10-CM | POA: Diagnosis not present

## 2023-02-08 DIAGNOSIS — E669 Obesity, unspecified: Secondary | ICD-10-CM | POA: Diagnosis not present

## 2023-03-13 DIAGNOSIS — M069 Rheumatoid arthritis, unspecified: Secondary | ICD-10-CM | POA: Diagnosis not present

## 2023-03-13 DIAGNOSIS — Z961 Presence of intraocular lens: Secondary | ICD-10-CM | POA: Diagnosis not present

## 2023-03-13 DIAGNOSIS — H26491 Other secondary cataract, right eye: Secondary | ICD-10-CM | POA: Diagnosis not present

## 2023-03-13 DIAGNOSIS — H401113 Primary open-angle glaucoma, right eye, severe stage: Secondary | ICD-10-CM | POA: Diagnosis not present

## 2023-03-15 IMAGING — MG MM DIGITAL SCREENING BILAT W/ TOMO AND CAD
8 series · 8 of 24 positions shown · non-contrast
Comparison: Previous exam(s).

CLINICAL DATA: Screening.

EXAM:
DIGITAL SCREENING BILATERAL MAMMOGRAM WITH TOMOSYNTHESIS AND CAD
TECHNIQUE: Bilateral screening digital craniocaudal and mediolateral oblique
mammograms were obtained. Bilateral screening digital breast
tomosynthesis was performed. The images were evaluated with
computer-aided detection.

[L CC synth-2D]
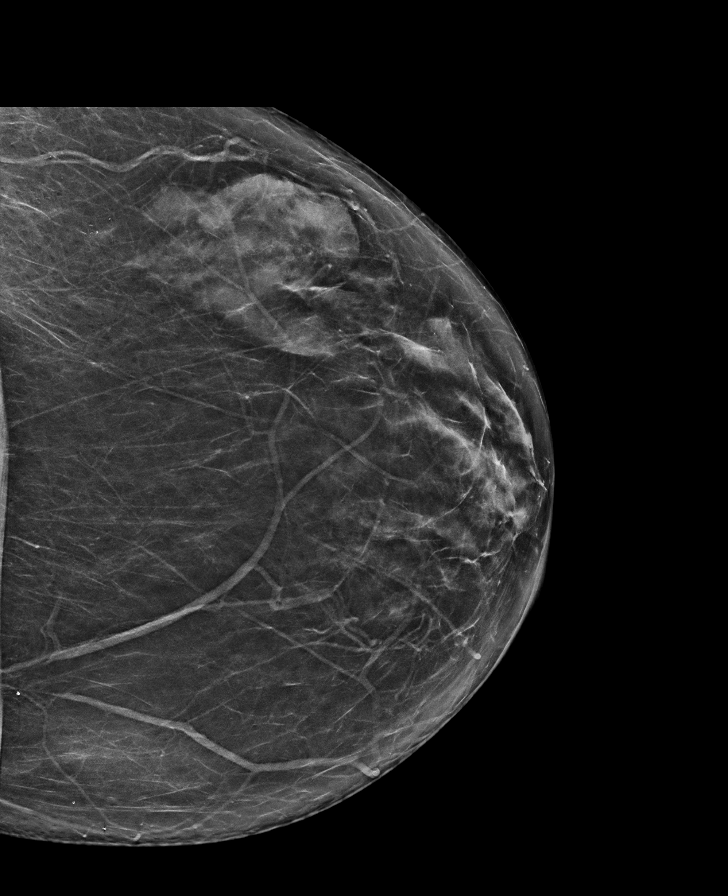

[L MLO synth-2D]
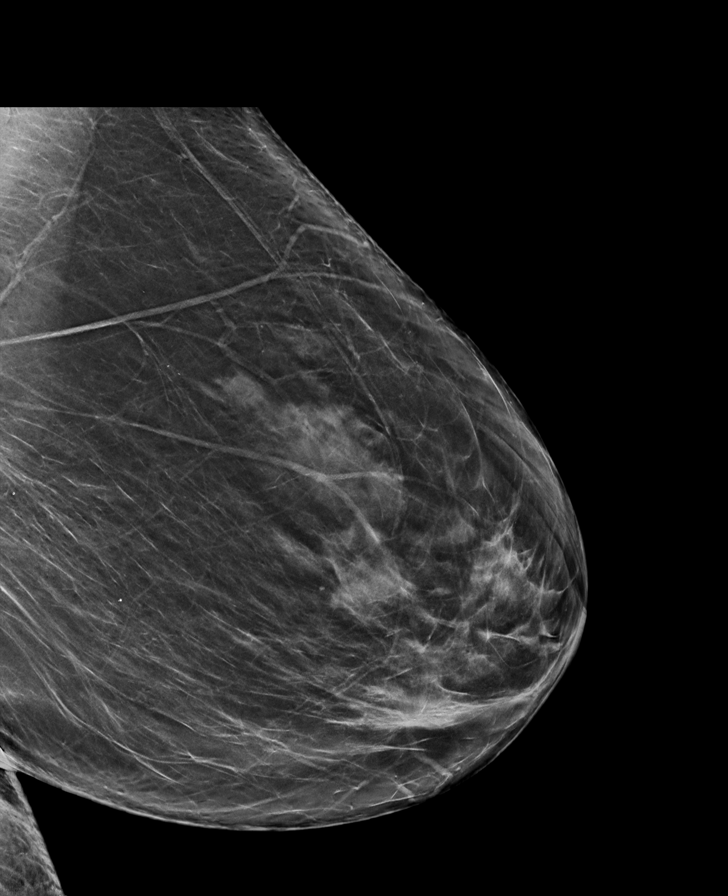

[R MLO synth-2D]
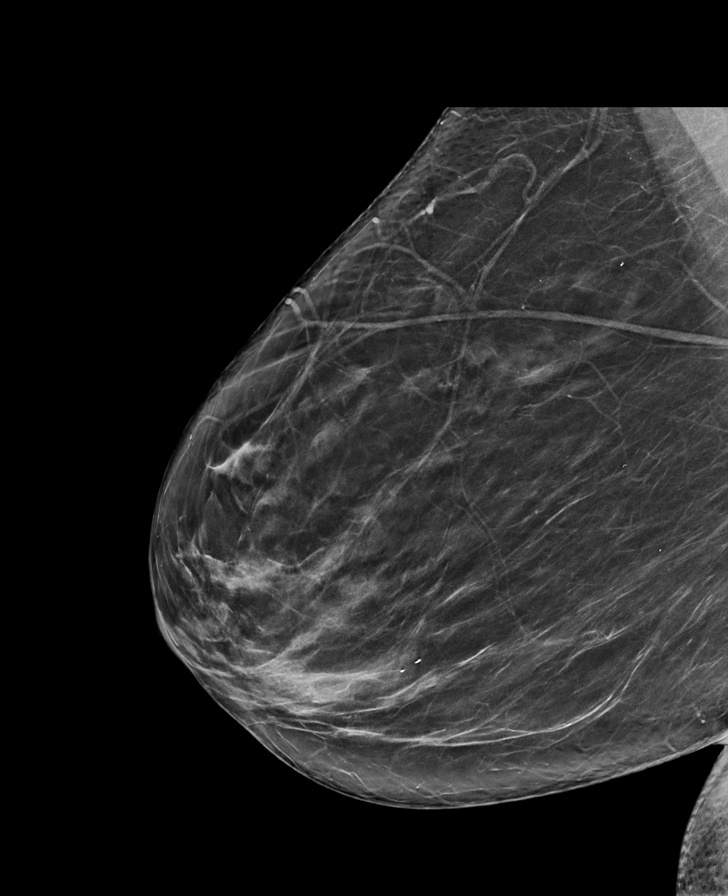

[R CC synth-2D]
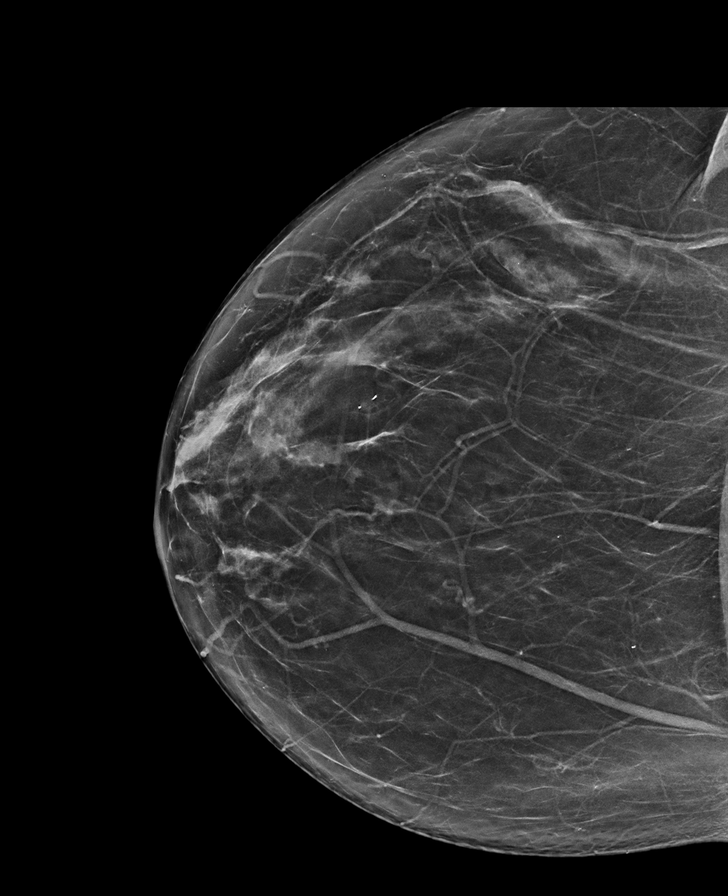

[L CC tomo · tomo slice 37/72.0]
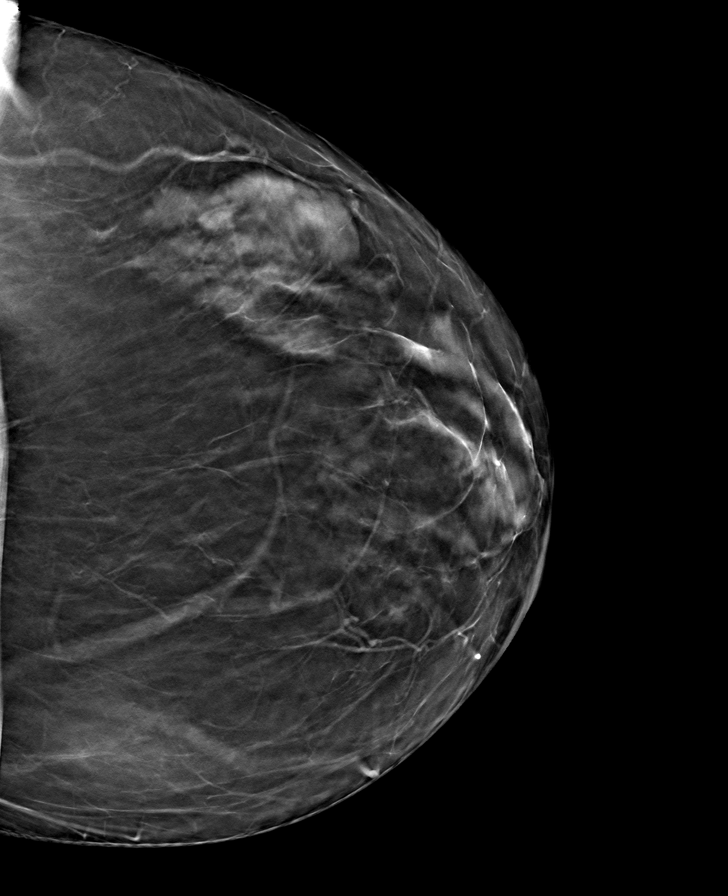

[R CC tomo · tomo slice 37/72.0]
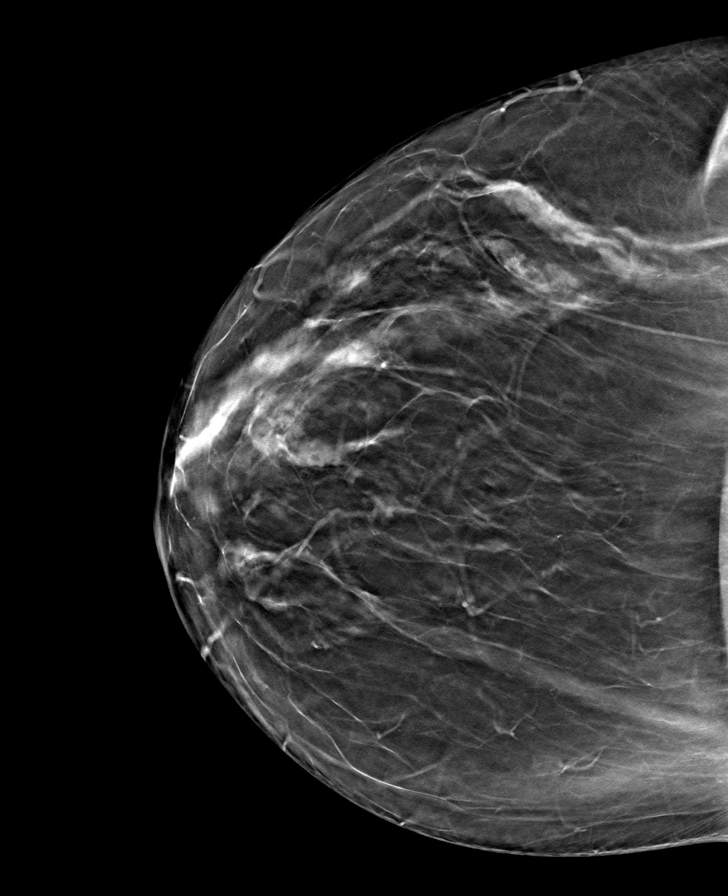

[L MLO tomo · tomo slice 41/80.0]
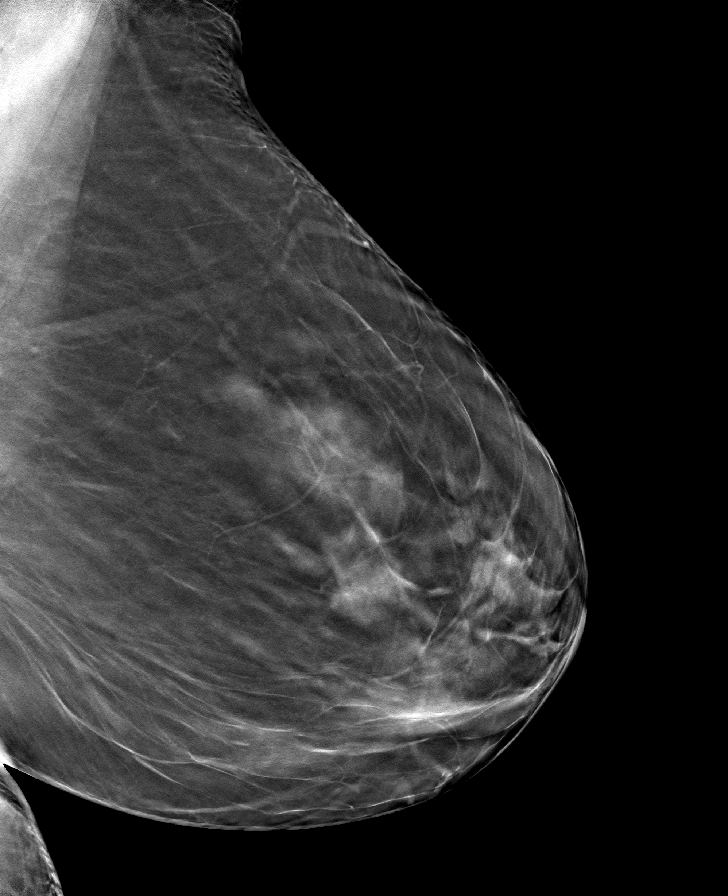

[R MLO tomo · tomo slice 41/81.0]
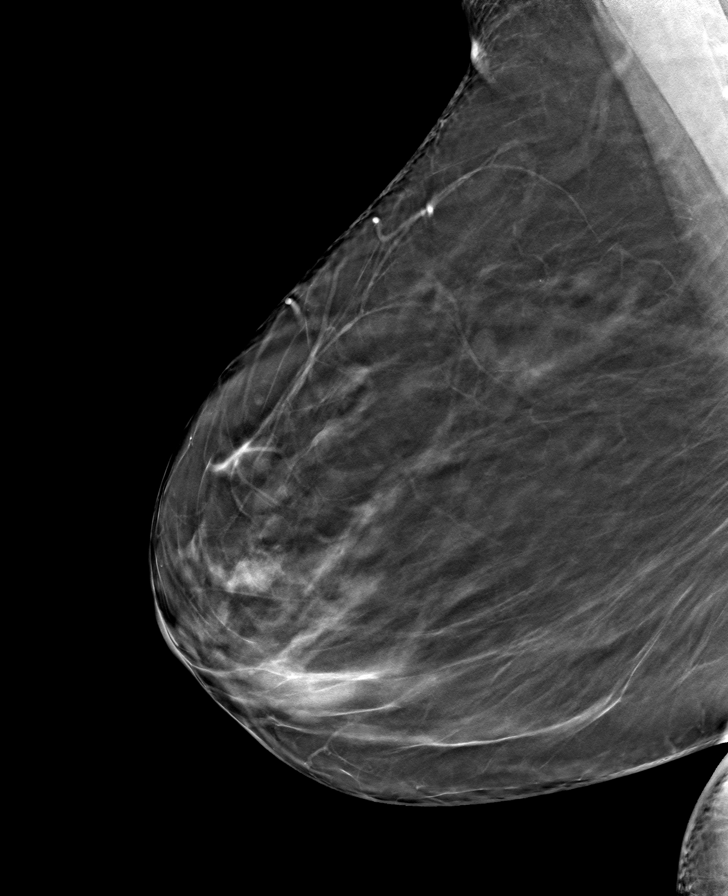

[8 of 24 positions shown; findings below may reference images not displayed]

ACR Breast Density Category b: There are scattered areas of
fibroglandular density.
FINDINGS: In the left breast, calcifications warrant further evaluation. In
the right breast, no findings suspicious for malignancy.
IMPRESSION: Further evaluation is suggested for calcifications in the left
breast.

RECOMMENDATION:
Diagnostic mammogram of the left breast. (Code:[id number])

The patient will be contacted regarding the findings, and additional
imaging will be scheduled.

BI-RADS CATEGORY  0: Incomplete. Need additional imaging evaluation
and/or prior mammograms for comparison.

## 2023-05-06 ENCOUNTER — Other Ambulatory Visit: Payer: Medicare PPO

## 2023-06-06 DIAGNOSIS — M0579 Rheumatoid arthritis with rheumatoid factor of multiple sites without organ or systems involvement: Secondary | ICD-10-CM | POA: Diagnosis not present

## 2023-08-19 DIAGNOSIS — Z79899 Other long term (current) drug therapy: Secondary | ICD-10-CM | POA: Diagnosis not present

## 2023-08-19 DIAGNOSIS — E669 Obesity, unspecified: Secondary | ICD-10-CM | POA: Diagnosis not present

## 2023-08-19 DIAGNOSIS — Z6831 Body mass index (BMI) 31.0-31.9, adult: Secondary | ICD-10-CM | POA: Diagnosis not present

## 2023-08-19 DIAGNOSIS — M0579 Rheumatoid arthritis with rheumatoid factor of multiple sites without organ or systems involvement: Secondary | ICD-10-CM | POA: Diagnosis not present

## 2023-08-19 DIAGNOSIS — R5382 Chronic fatigue, unspecified: Secondary | ICD-10-CM | POA: Diagnosis not present

## 2023-08-19 DIAGNOSIS — M318 Other specified necrotizing vasculopathies: Secondary | ICD-10-CM | POA: Diagnosis not present

## 2023-08-19 DIAGNOSIS — H548 Legal blindness, as defined in USA: Secondary | ICD-10-CM | POA: Diagnosis not present

## 2023-09-10 DIAGNOSIS — I10 Essential (primary) hypertension: Secondary | ICD-10-CM | POA: Diagnosis not present

## 2023-09-10 DIAGNOSIS — M069 Rheumatoid arthritis, unspecified: Secondary | ICD-10-CM | POA: Diagnosis not present

## 2023-09-10 DIAGNOSIS — E119 Type 2 diabetes mellitus without complications: Secondary | ICD-10-CM | POA: Diagnosis not present

## 2023-09-10 DIAGNOSIS — E7800 Pure hypercholesterolemia, unspecified: Secondary | ICD-10-CM | POA: Diagnosis not present

## 2023-09-10 DIAGNOSIS — Z0000 Encounter for general adult medical examination without abnormal findings: Secondary | ICD-10-CM | POA: Diagnosis not present

## 2023-09-10 DIAGNOSIS — G4733 Obstructive sleep apnea (adult) (pediatric): Secondary | ICD-10-CM | POA: Diagnosis not present

## 2023-09-10 DIAGNOSIS — H401113 Primary open-angle glaucoma, right eye, severe stage: Secondary | ICD-10-CM | POA: Diagnosis not present

## 2023-09-10 DIAGNOSIS — K219 Gastro-esophageal reflux disease without esophagitis: Secondary | ICD-10-CM | POA: Diagnosis not present

## 2023-09-10 DIAGNOSIS — E1169 Type 2 diabetes mellitus with other specified complication: Secondary | ICD-10-CM | POA: Diagnosis not present

## 2023-09-10 DIAGNOSIS — M810 Age-related osteoporosis without current pathological fracture: Secondary | ICD-10-CM | POA: Diagnosis not present

## 2023-09-11 ENCOUNTER — Other Ambulatory Visit: Payer: Self-pay | Admitting: Internal Medicine

## 2023-09-11 DIAGNOSIS — M810 Age-related osteoporosis without current pathological fracture: Secondary | ICD-10-CM

## 2023-11-19 DIAGNOSIS — M0579 Rheumatoid arthritis with rheumatoid factor of multiple sites without organ or systems involvement: Secondary | ICD-10-CM | POA: Diagnosis not present

## 2023-11-28 DIAGNOSIS — H401113 Primary open-angle glaucoma, right eye, severe stage: Secondary | ICD-10-CM | POA: Diagnosis not present

## 2024-02-17 DIAGNOSIS — R5382 Chronic fatigue, unspecified: Secondary | ICD-10-CM | POA: Diagnosis not present

## 2024-02-17 DIAGNOSIS — M79642 Pain in left hand: Secondary | ICD-10-CM | POA: Diagnosis not present

## 2024-02-17 DIAGNOSIS — M0579 Rheumatoid arthritis with rheumatoid factor of multiple sites without organ or systems involvement: Secondary | ICD-10-CM | POA: Diagnosis not present

## 2024-02-17 DIAGNOSIS — E669 Obesity, unspecified: Secondary | ICD-10-CM | POA: Diagnosis not present

## 2024-02-17 DIAGNOSIS — Z79899 Other long term (current) drug therapy: Secondary | ICD-10-CM | POA: Diagnosis not present

## 2024-02-17 DIAGNOSIS — Z6832 Body mass index (BMI) 32.0-32.9, adult: Secondary | ICD-10-CM | POA: Diagnosis not present

## 2024-03-09 DIAGNOSIS — M069 Rheumatoid arthritis, unspecified: Secondary | ICD-10-CM | POA: Diagnosis not present

## 2024-03-09 DIAGNOSIS — M810 Age-related osteoporosis without current pathological fracture: Secondary | ICD-10-CM | POA: Diagnosis not present

## 2024-03-09 DIAGNOSIS — K219 Gastro-esophageal reflux disease without esophagitis: Secondary | ICD-10-CM | POA: Diagnosis not present

## 2024-03-09 DIAGNOSIS — I10 Essential (primary) hypertension: Secondary | ICD-10-CM | POA: Diagnosis not present

## 2024-03-09 DIAGNOSIS — E1169 Type 2 diabetes mellitus with other specified complication: Secondary | ICD-10-CM | POA: Diagnosis not present

## 2024-03-09 DIAGNOSIS — E7800 Pure hypercholesterolemia, unspecified: Secondary | ICD-10-CM | POA: Diagnosis not present

## 2024-03-09 DIAGNOSIS — H401113 Primary open-angle glaucoma, right eye, severe stage: Secondary | ICD-10-CM | POA: Diagnosis not present

## 2024-04-20 ENCOUNTER — Other Ambulatory Visit: Payer: Self-pay | Admitting: Internal Medicine

## 2024-04-20 DIAGNOSIS — Z0000 Encounter for general adult medical examination without abnormal findings: Secondary | ICD-10-CM

## 2024-04-28 ENCOUNTER — Ambulatory Visit

## 2024-04-30 ENCOUNTER — Ambulatory Visit

## 2024-05-05 DIAGNOSIS — L308 Other specified dermatitis: Secondary | ICD-10-CM | POA: Diagnosis not present

## 2024-05-05 DIAGNOSIS — E1169 Type 2 diabetes mellitus with other specified complication: Secondary | ICD-10-CM | POA: Diagnosis not present

## 2024-05-12 ENCOUNTER — Ambulatory Visit

## 2024-05-12 DIAGNOSIS — R5382 Chronic fatigue, unspecified: Secondary | ICD-10-CM | POA: Diagnosis not present

## 2024-05-12 DIAGNOSIS — E669 Obesity, unspecified: Secondary | ICD-10-CM | POA: Diagnosis not present

## 2024-05-12 DIAGNOSIS — Z79899 Other long term (current) drug therapy: Secondary | ICD-10-CM | POA: Diagnosis not present

## 2024-05-12 DIAGNOSIS — Z6834 Body mass index (BMI) 34.0-34.9, adult: Secondary | ICD-10-CM | POA: Diagnosis not present

## 2024-05-12 DIAGNOSIS — M0579 Rheumatoid arthritis with rheumatoid factor of multiple sites without organ or systems involvement: Secondary | ICD-10-CM | POA: Diagnosis not present

## 2024-05-12 DIAGNOSIS — M318 Other specified necrotizing vasculopathies: Secondary | ICD-10-CM | POA: Diagnosis not present

## 2024-05-20 ENCOUNTER — Ambulatory Visit
Admission: RE | Admit: 2024-05-20 | Discharge: 2024-05-20 | Disposition: A | Discharge status: Home / Self Care | Source: Ambulatory Visit | Attending: Internal Medicine

## 2024-05-20 DIAGNOSIS — Z0000 Encounter for general adult medical examination without abnormal findings: Secondary | ICD-10-CM

## 2024-05-20 DIAGNOSIS — Z1231 Encounter for screening mammogram for malignant neoplasm of breast: Secondary | ICD-10-CM | POA: Diagnosis not present

## 2024-05-27 DIAGNOSIS — H1131 Conjunctival hemorrhage, right eye: Secondary | ICD-10-CM | POA: Diagnosis not present

## 2024-07-14 DIAGNOSIS — M810 Age-related osteoporosis without current pathological fracture: Secondary | ICD-10-CM | POA: Diagnosis not present

## 2024-09-09 DIAGNOSIS — Z0000 Encounter for general adult medical examination without abnormal findings: Secondary | ICD-10-CM | POA: Diagnosis not present

## 2024-09-09 DIAGNOSIS — E7800 Pure hypercholesterolemia, unspecified: Secondary | ICD-10-CM | POA: Diagnosis not present

## 2024-09-09 DIAGNOSIS — H401113 Primary open-angle glaucoma, right eye, severe stage: Secondary | ICD-10-CM | POA: Diagnosis not present

## 2024-09-09 DIAGNOSIS — G4733 Obstructive sleep apnea (adult) (pediatric): Secondary | ICD-10-CM | POA: Diagnosis not present

## 2024-09-09 DIAGNOSIS — I10 Essential (primary) hypertension: Secondary | ICD-10-CM | POA: Diagnosis not present

## 2024-09-09 DIAGNOSIS — K219 Gastro-esophageal reflux disease without esophagitis: Secondary | ICD-10-CM | POA: Diagnosis not present

## 2024-09-09 DIAGNOSIS — E1169 Type 2 diabetes mellitus with other specified complication: Secondary | ICD-10-CM | POA: Diagnosis not present

## 2024-09-09 DIAGNOSIS — M810 Age-related osteoporosis without current pathological fracture: Secondary | ICD-10-CM | POA: Diagnosis not present

## 2024-09-09 DIAGNOSIS — M069 Rheumatoid arthritis, unspecified: Secondary | ICD-10-CM | POA: Diagnosis not present

## 2024-09-16 DIAGNOSIS — M0579 Rheumatoid arthritis with rheumatoid factor of multiple sites without organ or systems involvement: Secondary | ICD-10-CM | POA: Diagnosis not present
